# Patient Record
Sex: Male | Born: 1980 | Race: White | Hispanic: No | Smoking: Never smoker
Health system: Southern US, Community
[De-identification: ages and names within clinical notes are randomized; demographics above are authoritative.]

---

## 2008-03-08 ENCOUNTER — Encounter: Admission: RE | Admit: 2008-03-08 | Discharge: 2008-03-08 | Payer: Self-pay | Admitting: Family Medicine

## 2009-11-04 IMAGING — CT CT PELVIS W/ CM
2 of 5 series · 17 of 46 positions shown, 19 images · IV contrast (READICAT/WATER & [ID] [id number])
Comparison: None

CT ABDOMEN

CLINICAL DATA: Mid abdominal and pelvic pain, bloating, poly urea,
loose stools

CT ABDOMEN AND PELVIS WITH CONTRAST
TECHNIQUE: Multidetector CT imaging of the abdomen and pelvis was
performed using the standard protocol following bolus
administration of intravenous contrast.
Contrast: 125 ml Xmnipaque-TZZ

[Series 3: routine abdomen · axial · 0.70mm/px · z∈[-478,-103]mm · 14 of 87 slices shown, 16 images]
[im 6/87  soft-tissue]
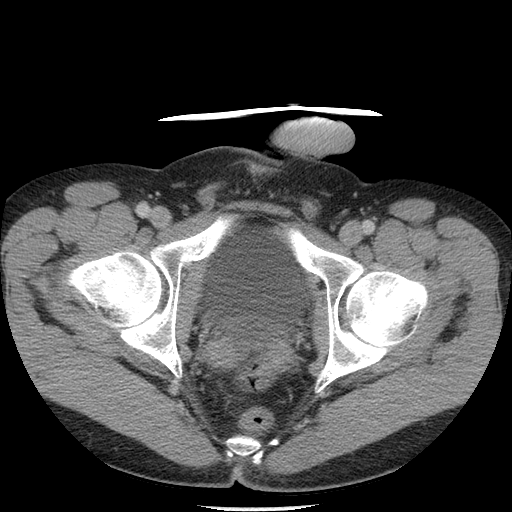
[im 6/87  bone]
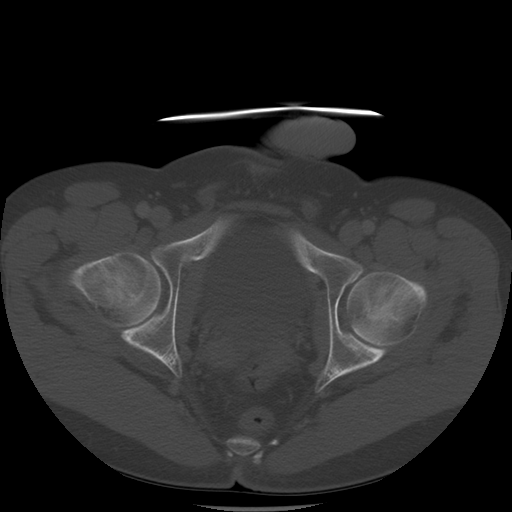
[im 11/87  soft-tissue]
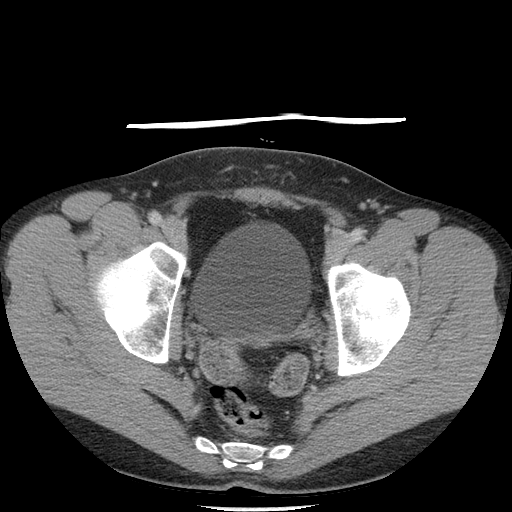
[im 16/87  soft-tissue]
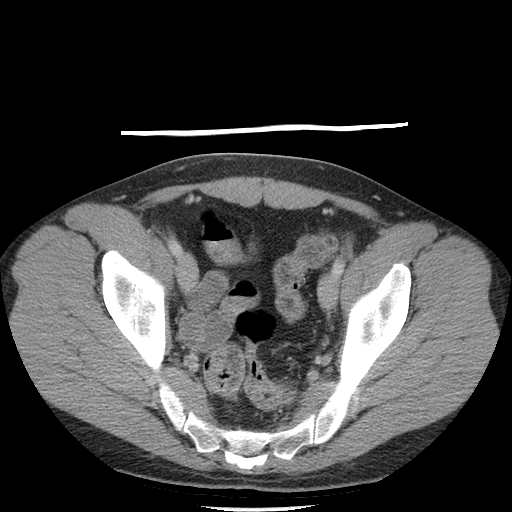
[im 26/87  soft-tissue]
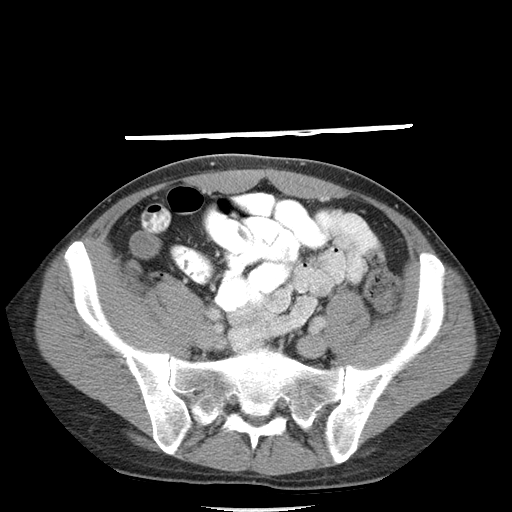
[im 31/87  soft-tissue]
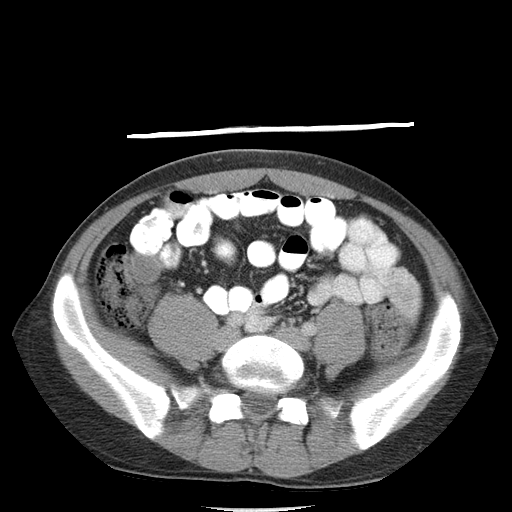
[im 36/87  soft-tissue]
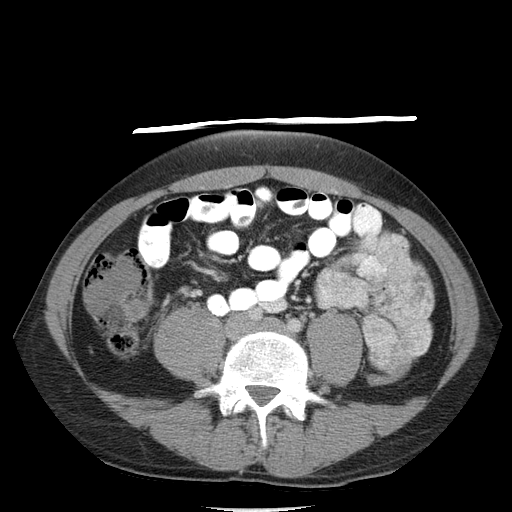
[im 41/87  soft-tissue]
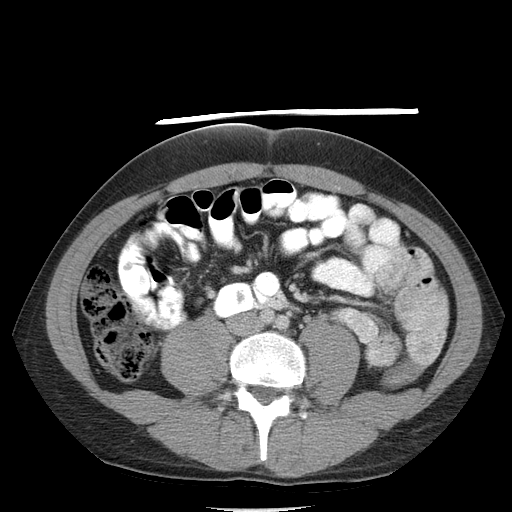
[im 46/87  soft-tissue]
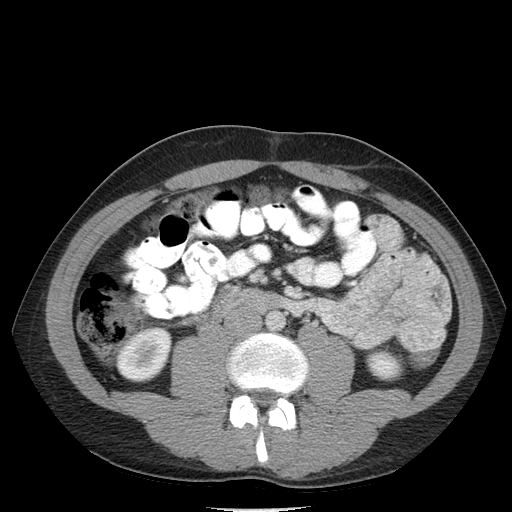
[im 51/87  soft-tissue]
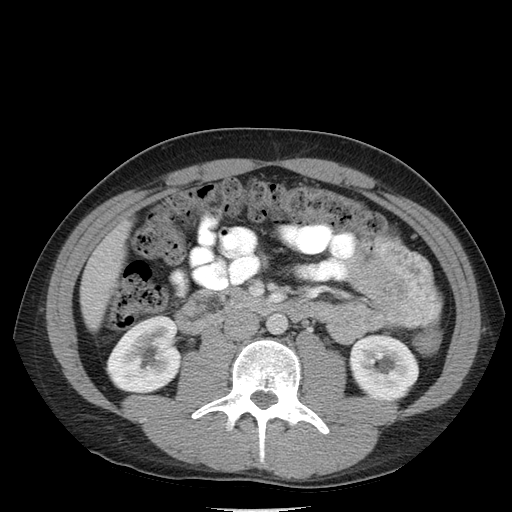
[im 51/87  bone]
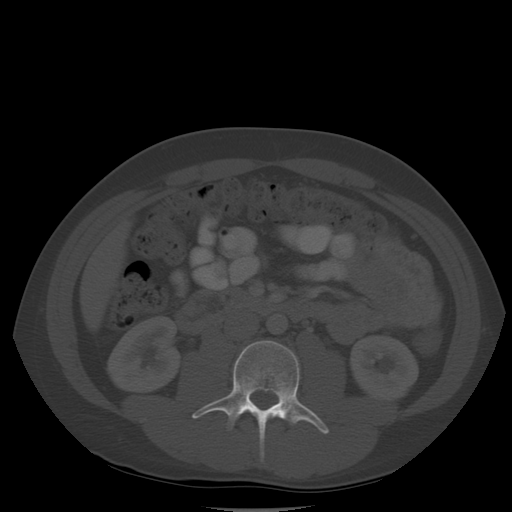
[im 56/87  soft-tissue]
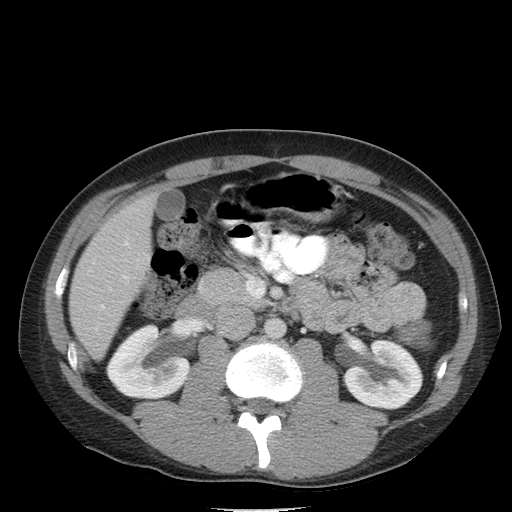
[im 66/87  soft-tissue]
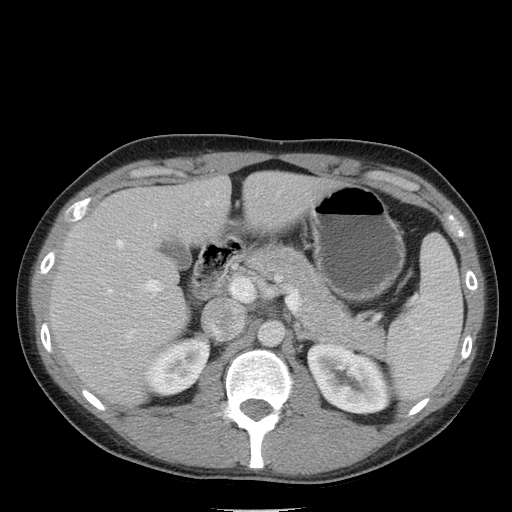
[im 71/87  soft-tissue]
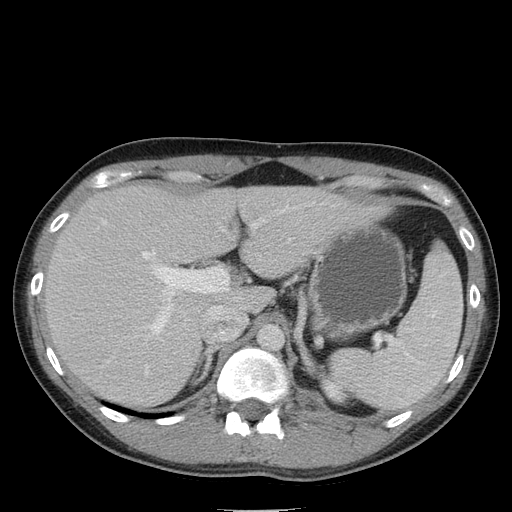
[im 76/87  soft-tissue]
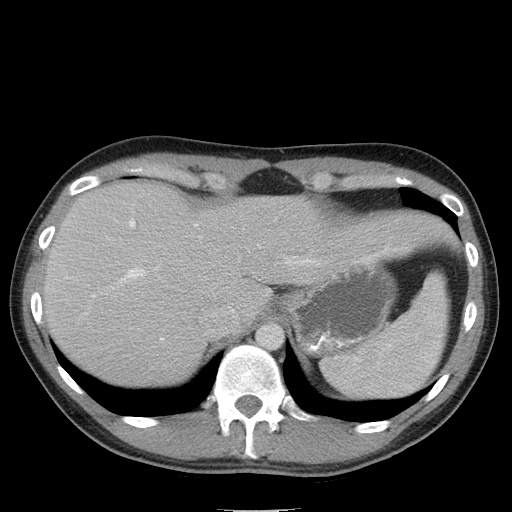
[im 81/87  soft-tissue]
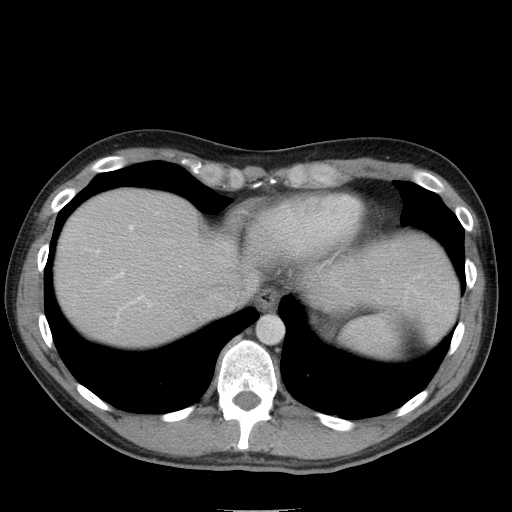

[Series 602: sagittal body · sagittal · 0.93mm/px · 3 of 145 slices shown]
[im 49/145  soft-tissue]
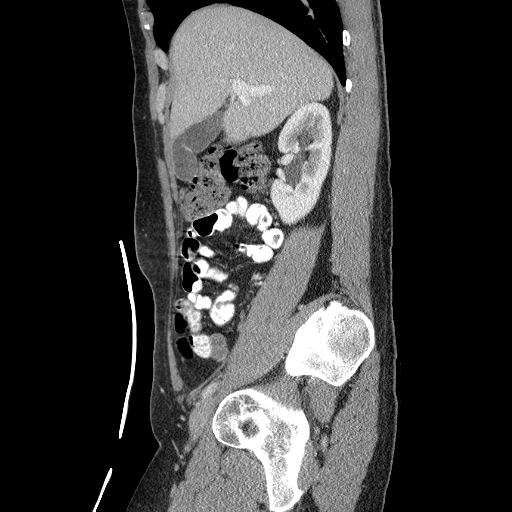
[im 65/145  soft-tissue]
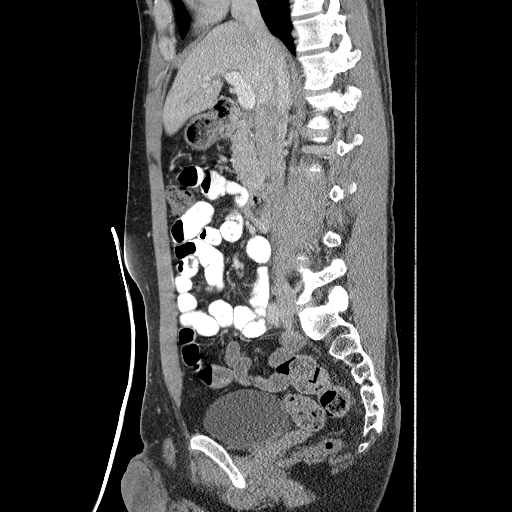
[im 81/145  soft-tissue]
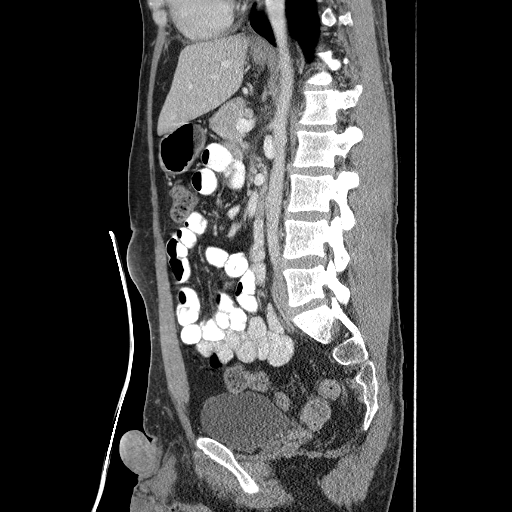

[17 of 46 positions shown; findings below may reference images not displayed]

FINDINGS: The lung bases are clear.  The liver enhances with no
focal abnormality and no ductal dilatation is seen.  No calcified
gallstones are noted.  The pancreas is normal in size and the
pancreatic duct is not dilated.  The adrenal glands and spleen
appear normal.  The kidneys enhance and no renal mass, calculus, or
hydronephrosis is seen.  Slightly prominent extrarenal pelves are
noted.  The abdominal aorta is normal in caliber.  No adenopathy is
seen.
IMPRESSION: No significant abnormality on CT of the abdomen.

CT PELVIS
FINDINGS: The appendix is moderately well seen and appears normal.
There is some fluid throughout the distal and terminal ileum
extending into the colon.  However no mucosal edema is noted.  The
urinary bladder is unremarkable.  The prostate is normal in size.
No fluid, mass, or adenopathy is seen within the pelvis.  No bony
abnormality is noted.
IMPRESSION: No significant abnormality on CT of the pelvis.  The appendix and
terminal ileum appear normal There is some fluid within the distal
ileum and colon.

REF:G3 DICTATED: 03/08/2008 [DATE]

## 2018-01-21 ENCOUNTER — Ambulatory Visit (INDEPENDENT_AMBULATORY_CARE_PROVIDER_SITE_OTHER): Payer: Self-pay | Admitting: Plastic Surgery

## 2018-01-21 ENCOUNTER — Encounter: Payer: Self-pay | Admitting: Plastic Surgery

## 2018-01-21 DIAGNOSIS — R220 Localized swelling, mass and lump, head: Secondary | ICD-10-CM

## 2018-01-21 NOTE — Progress Notes (Signed)
     Patient ID: Jeffrey Santos, male    DOB: 1980-08-12, 37 y.o.   MRN: [id number]   Chief Complaint  Patient presents with  . Advice Only    for excision of scalp cyst    The patient is a 37 year old white male here with his mom for evaluation of cysts on his scalp.  He has had them for several years but they seem to be getting larger.  He does not recall any trauma to the area.  He knows that there are other members in the family that have had this as well.  His mom has a history of skin cancer but no family history of melanoma.  He is in good health.  [place] and is here visiting his mom.  He does not have insurance and mom would like to pay for this.  I do not see any signs of this being malignant.  He is not having any other symptoms.  The masses are on the posterior occipital area.  The one that is more anterior is 4.5 cm.  There is another one posterior to that that is 1 cm in size.  And the most posterior one is 4 cm in size.  There is no sign of infection.  There is no redness.  There is some thinning of the skin and this was pointed out to him.  These are most likely cysts but could also be lipomas.   Review of Systems  Constitutional: Negative.   HENT: Negative.   Eyes: Negative.   Respiratory: Negative.   Cardiovascular: Negative.   Gastrointestinal: Negative.   Endocrine: Negative.   Musculoskeletal: Negative.   Skin: Negative.   Psychiatric/Behavioral: Negative.     History reviewed. No pertinent past medical history.  History reviewed. No pertinent surgical history.    Current Outpatient Medications:  .  amphetamine-dextroamphetamine (ADDERALL) 30 MG tablet, Take 30 mg by mouth daily., Disp: , Rfl:    Objective:   Vitals:   01/21/18 1049  BP: 132/80  Pulse: 100  SpO2: 98%    Physical Exam Vitals signs and nursing note reviewed.  Constitutional:      Appearance: Normal appearance.  HENT:     Head: Normocephalic and  atraumatic.     Mouth/Throat:     Mouth: Mucous membranes are moist.  Eyes:     Extraocular Movements: Extraocular movements intact.  Cardiovascular:     Rate and Rhythm: Normal rate.  Abdominal:     General: Abdomen is flat. There is no distension.  Skin:    General: Skin is warm.  Neurological:     Mental Status: He is alert.  Psychiatric:        Mood and Affect: Mood normal.        Thought Content: Thought content normal.        Judgment: Judgment normal.     Assessment & Plan:  Mass of scalp   Plan for excision of 3 masses of the forehead.  At a later date we can do an excision of the mole on his glabellar area.  Alena Billslaire S Wilferd Ritson, DO

## 2018-04-01 ENCOUNTER — Telehealth: Payer: Self-pay | Admitting: Plastic Surgery

## 2018-04-01 NOTE — Telephone Encounter (Signed)
Left patient message on voicemail stating that surgery would have to be cancelled since it could not be confirmed. Patient instructed to call back if interested in rescheduling.

## 2018-04-15 ENCOUNTER — Encounter: Payer: Self-pay | Admitting: Plastic Surgery

## 2018-04-29 ENCOUNTER — Encounter: Payer: Self-pay | Admitting: Plastic Surgery

## 2018-05-03 ENCOUNTER — Encounter: Payer: Self-pay | Admitting: Plastic Surgery

## 2018-07-15 ENCOUNTER — Encounter: Payer: Self-pay | Admitting: Plastic Surgery

## 2018-07-15 ENCOUNTER — Ambulatory Visit (INDEPENDENT_AMBULATORY_CARE_PROVIDER_SITE_OTHER): Payer: Self-pay | Admitting: Plastic Surgery

## 2018-07-15 ENCOUNTER — Other Ambulatory Visit: Payer: Self-pay

## 2018-07-15 DIAGNOSIS — R220 Localized swelling, mass and lump, head: Secondary | ICD-10-CM

## 2018-07-15 MED ORDER — AMOXICILLIN-POT CLAVULANATE 500-125 MG PO TABS: 1.0000 | ORAL_TABLET | Freq: Three times a day (TID) | ORAL | 0 refills | Status: AC

## 2018-07-15 MED ORDER — ONDANSETRON HCL 4 MG PO TABS: 4.0000 mg | ORAL_TABLET | Freq: Three times a day (TID) | ORAL | 0 refills | Status: AC | PRN

## 2018-07-15 MED ORDER — HYDROCODONE-ACETAMINOPHEN 5-325 MG PO TABS
1.0000 | ORAL_TABLET | Freq: Four times a day (QID) | ORAL | 0 refills | Status: AC | PRN
Start: 2018-07-15 — End: 2018-07-20

## 2018-07-15 NOTE — Progress Notes (Signed)
   Subjective:    Patient ID: Jeffrey Santos, male    DOB: 11-28-1980, 38 y.o.   MRN: [id number]  The patient is a 38 year old man on a telemetry visit with me today for his preoperative consultation for surgery.  He went to a urgent care recently for some shortness of breath.  He believed that it was an exacerbation of his asthma.  He was given albuterol and that seems to have helped.  He also was seen for a toe nail that was hanging off.  He was given Keflex.  He has not had any other accidents, injuries or recent illnesses.  We are planning on excising 3 scalp masses.  Likely lipoma or cyst.  They have been stable.    Review of Systems  Constitutional: Negative.   HENT: Negative.   Eyes: Negative.   Respiratory: Positive for shortness of breath. Negative for choking.        Was started on albuterol  Cardiovascular: Negative.  Negative for leg swelling.  Gastrointestinal: Negative for abdominal pain.  Endocrine: Negative.   Genitourinary: Negative.   Musculoskeletal: Negative.   Skin: Positive for wound.       Broken toe nail  Neurological: Negative.   Hematological: Negative.   Psychiatric/Behavioral: Negative.        Objective:   Physical Exam Neurological:     Mental Status: He is alert.       Assessment & Plan:     ICD-10-CM   1. Mass of scalp  R22.0     Plan for excision of scalp lesions with pathology evaluation. The risks that can be encountered with and after excision of a scalp lesion were discussed and include the following but not limited to these: bleeding, infection, delayed healing, anesthesia risks, skin sensation changes, injury to structures including nerves, blood vessels, and muscles which may be temporary or permanent, allergies to tape, suture materials and glues, blood products, topical preparations or injected agents, skin contour irregularities, skin discoloration and swelling, deep vein thrombosis, cardiac and pulmonary complications, pain, which may  persist, persistent pain, recurrence of the lesion, poor healing of the incision, possible need for revisional surgery or staged procedures.   Prescription sent to pharmacy. The patient gave consent to have this visit done by telemedicine / virtual visit.  This is also consent for access the chart and treat the patient via this visit. The patient is located home.  I, the provider, am at the office.  We spent 10 minutes together for the visit.

## 2018-08-11 ENCOUNTER — Telehealth: Payer: Self-pay | Admitting: Plastic Surgery

## 2018-08-11 NOTE — Telephone Encounter (Signed)

## 2018-08-12 ENCOUNTER — Encounter: Payer: Self-pay | Admitting: Nurse Practitioner

## 2018-08-12 ENCOUNTER — Other Ambulatory Visit: Payer: Self-pay

## 2018-08-12 ENCOUNTER — Ambulatory Visit (INDEPENDENT_AMBULATORY_CARE_PROVIDER_SITE_OTHER): Payer: Self-pay | Admitting: Nurse Practitioner

## 2018-08-12 VITALS — BP 124/79 | HR 71 | Temp 97.3°F | Wt 243.0 lb

## 2018-08-12 DIAGNOSIS — R220 Localized swelling, mass and lump, head: Secondary | ICD-10-CM

## 2018-08-12 DIAGNOSIS — L7211 Pilar cyst: Secondary | ICD-10-CM

## 2018-08-12 MED ORDER — HYDROCODONE-ACETAMINOPHEN 5-325 MG PO TABS: 1.0000 | ORAL_TABLET | Freq: Two times a day (BID) | ORAL | 0 refills | Status: AC | PRN

## 2018-08-12 NOTE — Progress Notes (Signed)
     Patient ID: Jeffrey Santos, male    DOB: 10-20-1980, 38 y.o.   MRN: [id number]   C.C.: follow up for excision of scalp lesion   Jeffrey Santos is a 38 yo male s/p excision of 4 scalp lesions on 08/01/18, performed at the [facility]. 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 Patient report intermittent sharp pain at the incision sites. He been taking Norco and tylenol as needed. Patient is requesting a few more doses of Norco. Patient is taking the BAR exam next week. He would like to wait for suture removal after that time.    Review of Systems  Constitutional: Negative.   HENT:       Pain and itching at scalp incisions  Respiratory: Negative.   Cardiovascular: Negative.   Gastrointestinal: Negative.   Genitourinary: Negative.   Musculoskeletal: Negative.   Neurological: Negative.     No past medical history on file.  No past surgical history on file.    Current Outpatient Medications:  .  ALPRAZolam (XANAX) 0.25 MG tablet, Take 0.25 mg by mouth at bedtime as needed for anxiety., Disp: , Rfl:  .  cephALEXin (KEFLEX) 250 MG capsule, Take by mouth 3 (three) times daily., Disp: , Rfl:  .  FLUoxetine (PROZAC) 10 MG tablet, Take 10 mg by mouth daily., Disp: , Rfl:  .  amphetamine-dextroamphetamine (ADDERALL) 30 MG tablet, Take 30 mg by mouth daily., Disp: , Rfl:    Objective:   Vitals:   08/12/18 1151  BP: 124/79  Pulse: 71  Temp: (!) 97.3 F (36.3 C)  SpO2: 100%    Physical Exam  General: alert, slightly anxious, no acute distress HEENT:approximated scalp incisions, mild erythema and edema at posterior caudal incision, no drainage Neck: supple, full ROM Chest: symmetrical rise and fall Lungs: unlabored breathing Musculoskeletal: MAEx4 Neuro: A&O x3, calm, cooperative   Assessment & Plan:  Jeffrey Santos is a 38 yo POD #11 s/p excision of scalp lesions. Pathology demonstrates pilar cysts. He has some discomfort at the incisions. He  was encouraged to alternate ibuprofen and tylenol during the day. He was provided a prescription for a few more doses of Norco to take at night as needed. Will leave sutures in place for another 1-2 weeks due to avoid dehiscence of the incision.    Mayah Dozier-Lineberger, FNP-C

## 2018-08-26 ENCOUNTER — Ambulatory Visit: Payer: Self-pay | Admitting: Surgical

## 2018-09-01 ENCOUNTER — Other Ambulatory Visit: Payer: Self-pay

## 2018-09-01 ENCOUNTER — Encounter: Payer: Self-pay | Admitting: Surgical

## 2018-09-01 ENCOUNTER — Ambulatory Visit (INDEPENDENT_AMBULATORY_CARE_PROVIDER_SITE_OTHER): Payer: Self-pay | Admitting: Surgical

## 2018-09-01 VITALS — BP 136/83 | HR 83 | Temp 96.8°F | Ht 78.0 in | Wt 243.4 lb

## 2018-09-01 DIAGNOSIS — L7211 Pilar cyst: Secondary | ICD-10-CM

## 2018-09-01 DIAGNOSIS — R220 Localized swelling, mass and lump, head: Secondary | ICD-10-CM

## 2018-09-01 NOTE — Progress Notes (Signed)
The patient is a 38 y.o. male here for follow-up after excision of 4 scalp lesion on 08/01/2018 with Dr. Marla Roe.  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  Jeffrey Santos reports that today is doing very well.  He has had no pain, no fevers, no chills.  His incisions are healing well.  Sutures removed today. He has not noted any drainage or blood on his pillow in the morning.  He can follow-up as needed.

## 2019-04-28 ENCOUNTER — Ambulatory Visit: Payer: Self-pay | Attending: Internal Medicine

## 2019-04-28 DIAGNOSIS — Z23 Encounter for immunization: Secondary | ICD-10-CM

## 2019-04-28 NOTE — Progress Notes (Signed)
   Covid-19 Vaccination Clinic  Name:  [place] Etienne    MRN: [id number] DOB: 03-Aug-1980  04/28/2019  Mr. Deren was observed post Covid-19 immunization for 15 minutes without incident. He was provided with Vaccine Information Sheet and instruction to access the V-Safe system.   Mr. Cromley was instructed to call 911 with any severe reactions post vaccine: Marland Kitchen Difficulty breathing  . Swelling of face and throat  . A fast heartbeat  . A bad rash all over body  . Dizziness and weakness   Immunizations Administered    Name Date Dose VIS Date Route   Pfizer COVID-19 Vaccine 04/28/2019  1:49 PM 0.3 mL 01/13/2019 Intramuscular   Manufacturer: [employer], [employer]   Lot: [id number]   NDC: [address]-1

## 2019-05-23 ENCOUNTER — Ambulatory Visit: Payer: Self-pay | Attending: Internal Medicine

## 2019-05-23 DIAGNOSIS — Z23 Encounter for immunization: Secondary | ICD-10-CM

## 2019-05-23 NOTE — Progress Notes (Signed)
   Covid-19 Vaccination Clinic  Name:  Jeffrey Santos    MRN: [id number] DOB: 07-Jul-1980  05/23/2019  Mr. Jeffrey Santos was observed post Covid-19 immunization for 15 minutes without incident. He was provided with Vaccine Information Sheet and instruction to access the V-Safe system.   Mr. Jeffrey Santos was instructed to call 911 with any severe reactions post vaccine: Marland Kitchen Difficulty breathing  . Swelling of face and throat  . A fast heartbeat  . A bad rash all over body  . Dizziness and weakness   Immunizations Administered    Name Date Dose VIS Date Route   Pfizer COVID-19 Vaccine 05/23/2019  2:11 PM 0.3 mL 03/29/2018 Intramuscular   Manufacturer: [employer], [employer]   Lot: [id number]   NDC: [address]-2
